# Patient Record
Sex: Female | Born: 1995 | Race: White | Hispanic: No | Marital: Single | Smoking: Never smoker
Health system: Southern US, Community
[De-identification: ages and names within clinical notes are randomized; demographics above are authoritative.]

## PROBLEM LIST (undated history)

## (undated) HISTORY — PX: WISDOM TOOTH EXTRACTION: SHX21

---
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
## 2015-04-28 IMAGING — CR DG KNEE COMPLETE 4+V*R*
4 series · 4 of 4 positions shown · non-contrast
Comparison: None.

CLINICAL DATA: Pain post trauma

EXAM:
RIGHT KNEE - COMPLETE 4+ VIEW

[t knee ap right]
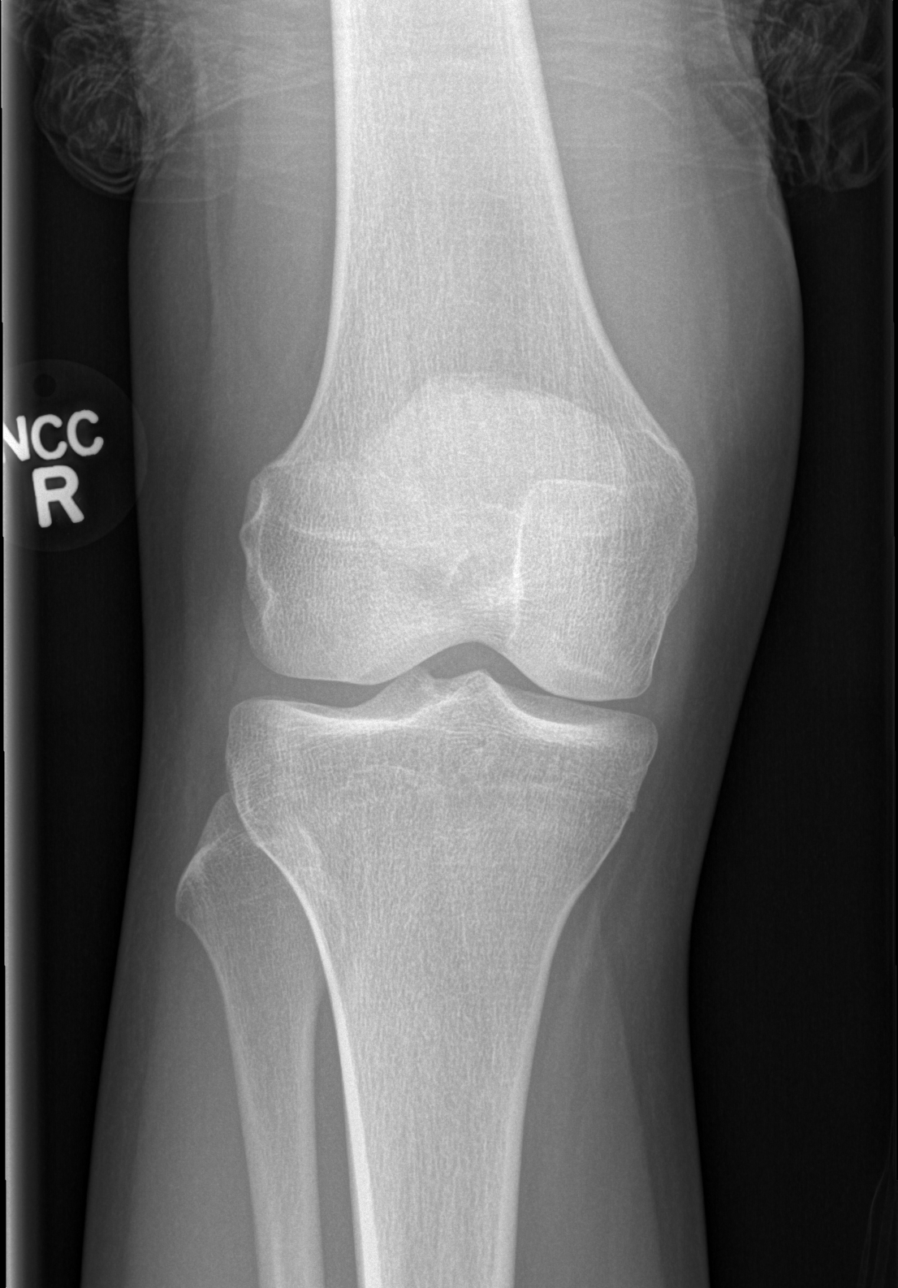

[t knee obl right (1 of 2)]
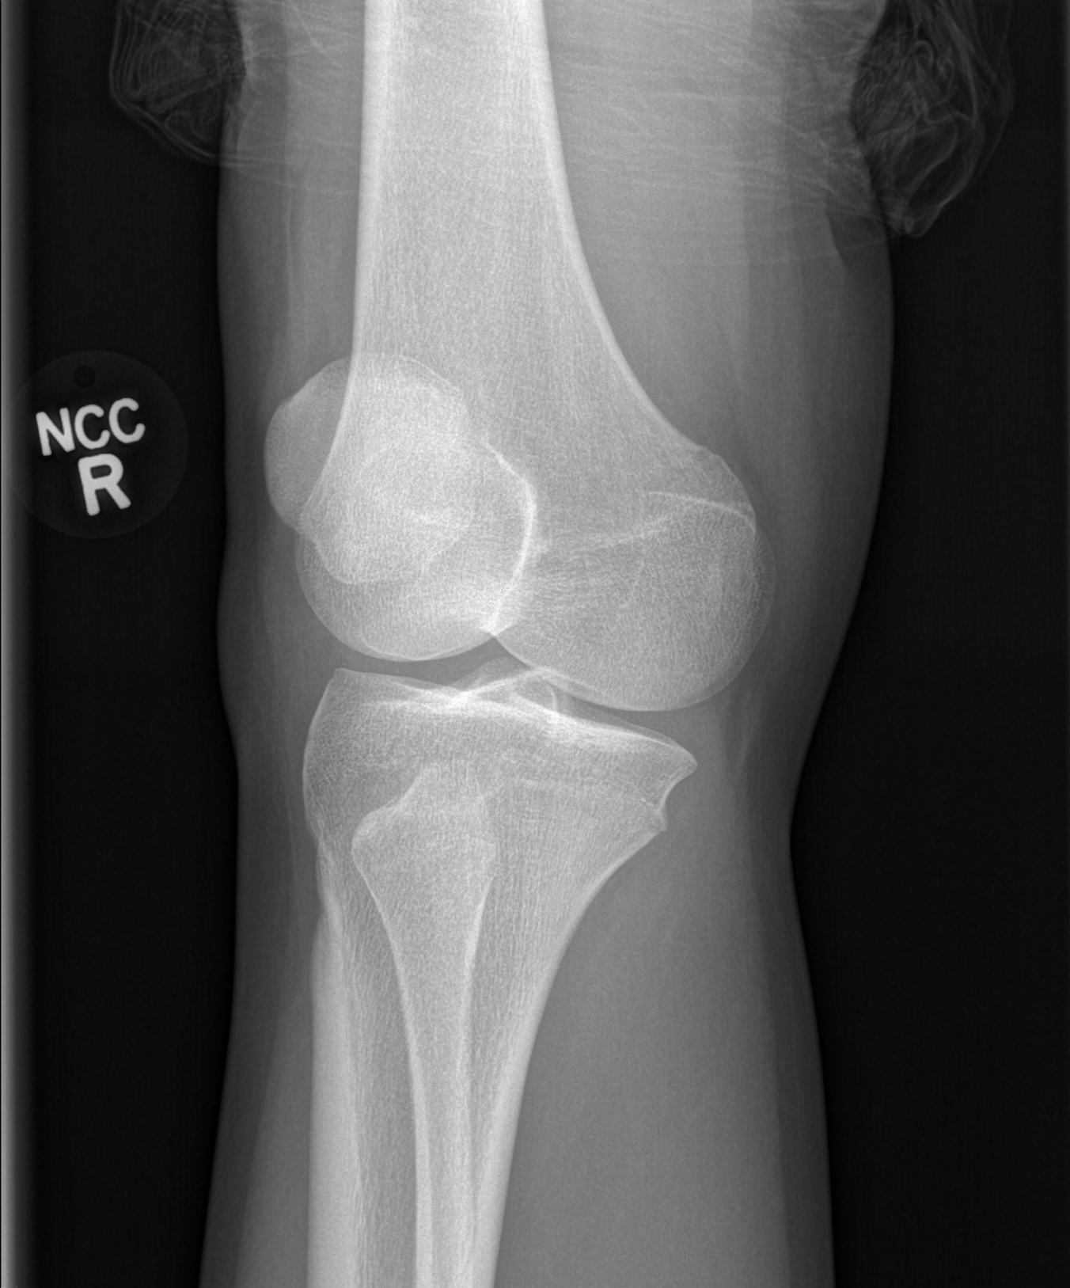

[t knee obl right (2 of 2)]
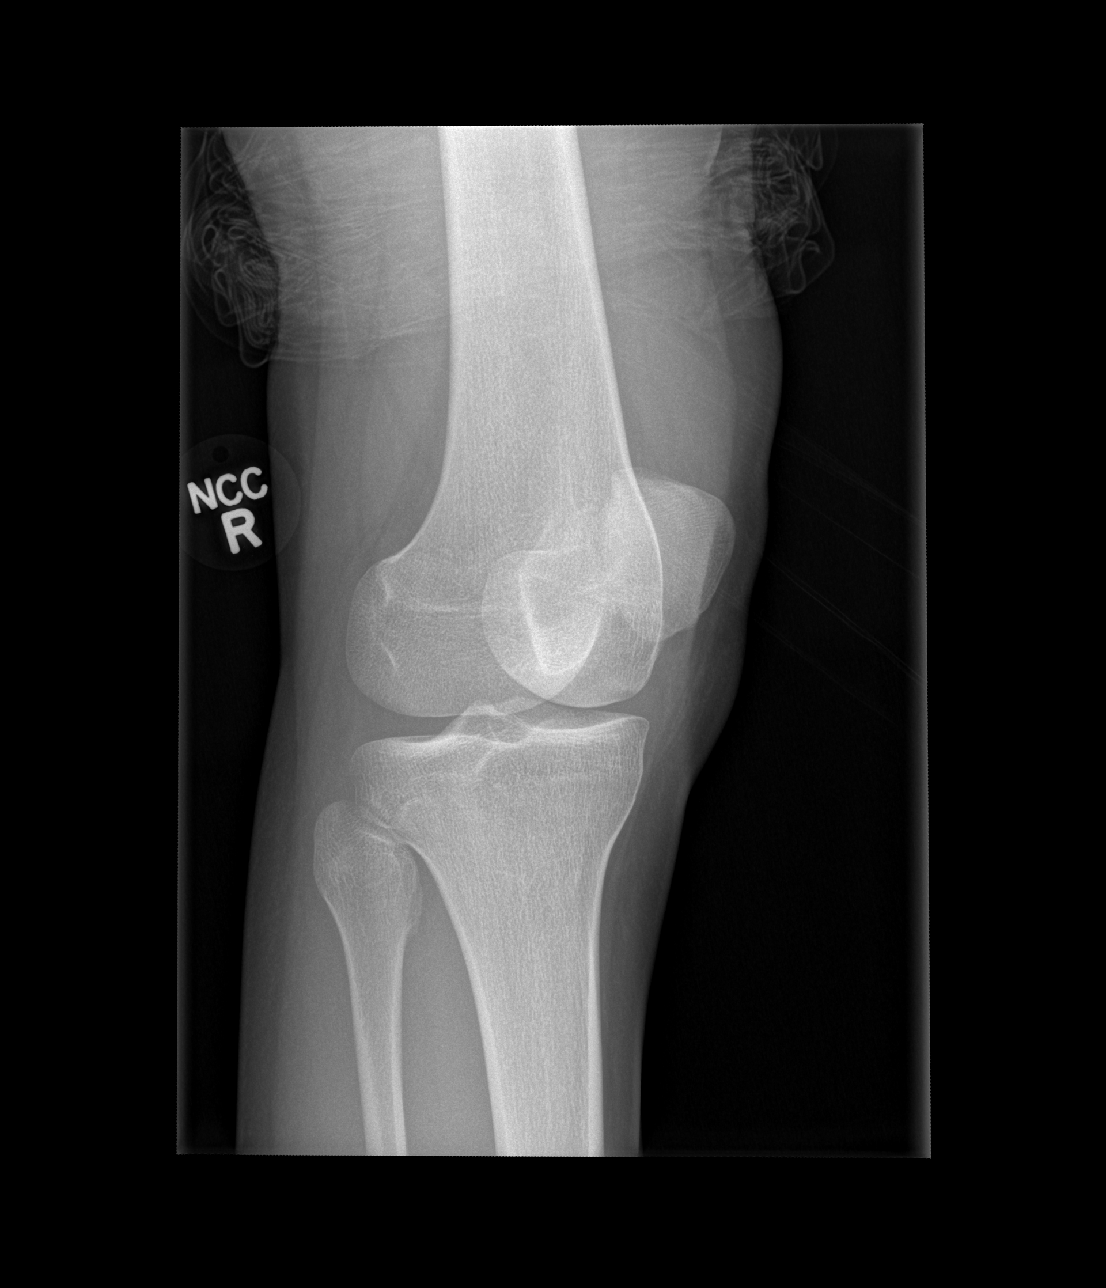

[t knee lat right]
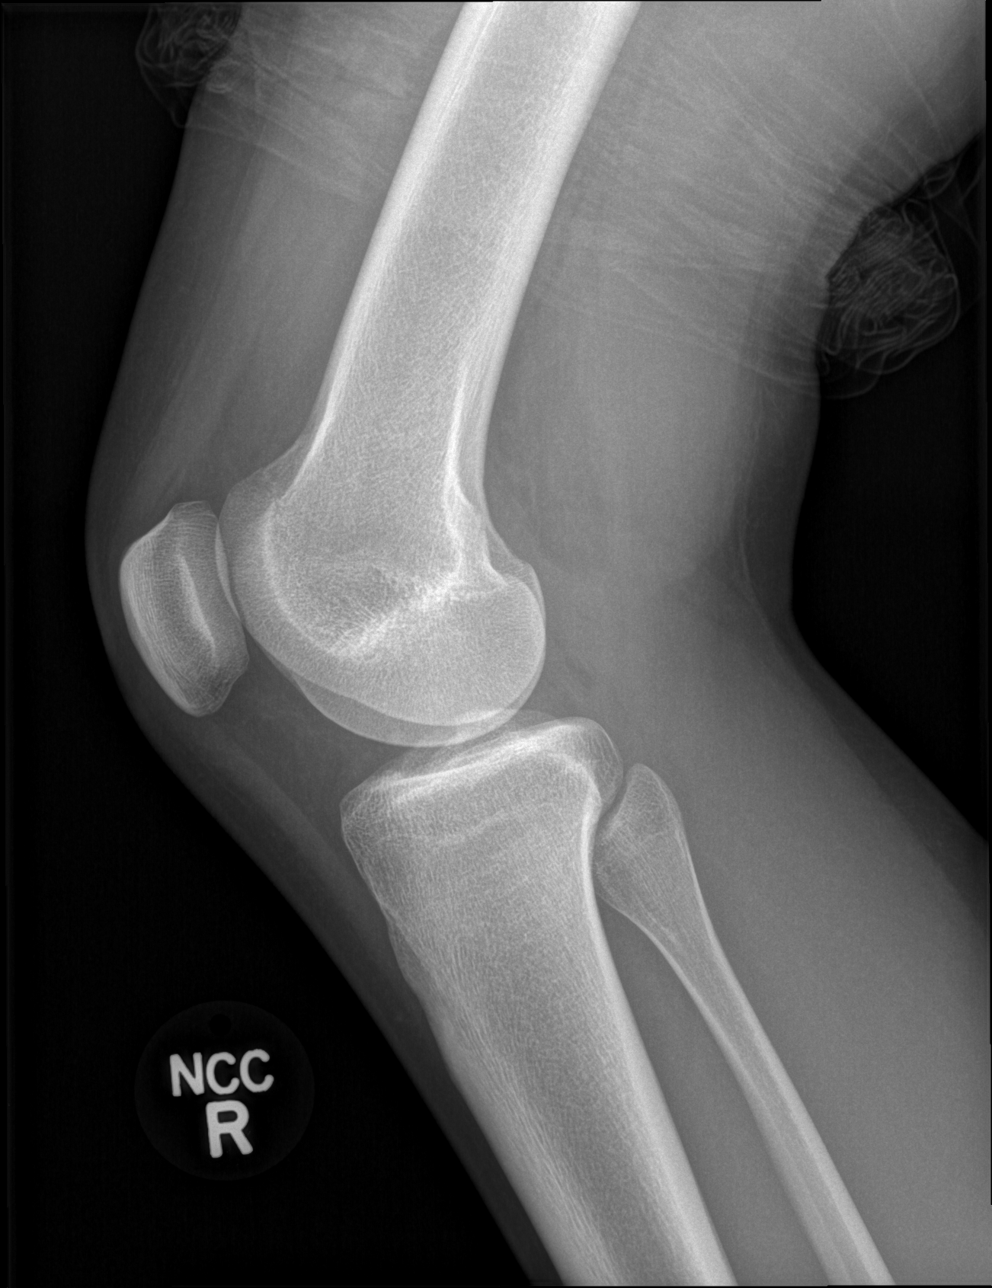

[4 of 4 positions shown; findings below may reference images not displayed]

FINDINGS: Frontal, lateral, and bilateral oblique views were obtained. There
is no fracture, dislocation, or effusion. Joint spaces appear
intact. No erosive change. No bony destruction. There is no apparent
soft tissue abscess.
IMPRESSION: No abnormality noted radiographically.

## 2015-05-16 ENCOUNTER — Ambulatory Visit (INDEPENDENT_AMBULATORY_CARE_PROVIDER_SITE_OTHER): Payer: BC Managed Care – PPO | Admitting: Emergency Medicine

## 2015-05-16 VITALS — BP 118/76 | HR 97 | Temp 99.2°F | Resp 16 | Ht 65.0 in | Wt 114.0 lb

## 2015-05-16 DIAGNOSIS — J0140 Acute pansinusitis, unspecified: Secondary | ICD-10-CM

## 2015-05-16 DIAGNOSIS — J209 Acute bronchitis, unspecified: Secondary | ICD-10-CM

## 2015-05-16 DIAGNOSIS — J0300 Acute streptococcal tonsillitis, unspecified: Secondary | ICD-10-CM | POA: Diagnosis not present

## 2015-05-16 MED ORDER — AMOXICILLIN-POT CLAVULANATE 875-125 MG PO TABS: 1.0000 | ORAL_TABLET | Freq: Two times a day (BID) | ORAL | Status: DC

## 2015-05-16 MED ORDER — PSEUDOEPHEDRINE-GUAIFENESIN ER 60-600 MG PO TB12: 1.0000 | ORAL_TABLET | Freq: Two times a day (BID) | ORAL | Status: AC

## 2015-05-16 MED ORDER — HYDROCOD POLST-CPM POLST ER 10-8 MG/5ML PO SUER: 5.0000 mL | Freq: Two times a day (BID) | ORAL | Status: DC

## 2015-05-16 NOTE — Progress Notes (Signed)
Subjective:  Patient ID: Morgan Mercer, female    DOB: [birth year]  Age: 19 y.o. MRN: [id number]  CC: Sore Throat; Sinus Problem; Generalized Body Aches; and Headache   HPI Morgan Mercer presents  patient was seen [facility] [place] told she did not have strep throat she was discharged and now has come into the office with marked sore throat she has green purulent nasal drainage and postnasal drip. She has no nausea vomiting or stool change she has a nonproductive cough she has marked myalgias arthralgias or fever. She has no chills nausea vomiting stool change or rash. She's been unable to control her symptoms with over-the-counter medication  History Morgan Mercer has no past medical history on file.   She has past surgical history that includes Wisdom tooth extraction.   Her  family history includes Asthma in her sister; Cancer in her paternal uncle; Diabetes in her paternal uncle; Hypertension in her maternal grandmother; Hypothyroidism in her maternal aunt, maternal grandfather, and paternal grandmother.  She   reports that she has never smoked. She has never used smokeless tobacco. She reports that she does not drink alcohol or use illicit drugs.  Outpatient Prescriptions Prior to Visit  Medication Sig Dispense Refill  . lisdexamfetamine (VYVANSE) 20 MG capsule Take 20 mg by mouth daily.    . AMOXICILLIN PO Take by mouth 3 (three) times daily.    Marland Kitchen HYDROcodone-acetaminophen (NORCO/VICODIN) 5-325 MG per tablet Take 1 tablet by mouth every 4 (four) hours as needed. (Patient not taking: Reported on 05/16/2015) 15 tablet 0   No facility-administered medications prior to visit.    Social History   Social History  . Marital Status: Single    Spouse Name: N/A  . Number of Children: N/A  . Years of Education: N/A   Social History Main Topics  . Smoking status: Never Smoker   . Smokeless tobacco: Never Used  . Alcohol Use: No  . Drug Use: No  . Sexual Activity: Not  Asked   Other Topics Concern  . None   Social History Narrative     Review of Systems  Constitutional: Positive for fever, chills and fatigue. Negative for appetite change.  HENT: Positive for congestion, postnasal drip, rhinorrhea, sinus pressure and sore throat. Negative for ear pain.   Eyes: Negative for pain and redness.  Respiratory: Positive for cough. Negative for shortness of breath and wheezing.   Cardiovascular: Negative for leg swelling.  Gastrointestinal: Negative for nausea, vomiting, abdominal pain, diarrhea, constipation and blood in stool.  Endocrine: Negative for polyuria.  Genitourinary: Negative for dysuria, urgency, frequency and flank pain.  Musculoskeletal: Negative for gait problem.  Skin: Negative for rash.  Neurological: Negative for weakness and headaches.  Psychiatric/Behavioral: Negative for confusion and decreased concentration. The patient is not nervous/anxious.     Objective:  BP 118/76 mmHg  Pulse 97  Temp(Src) 99.2 F (37.3 C) (Oral)  Resp 16  Ht  (1.651 m)  Wt 114 lb (51.71 kg)  BMI 18.97 kg/m2  SpO2 97%  Physical Exam  Constitutional: She is oriented to person, place, and time. She appears well-developed and well-nourished. No distress.  HENT:  Head: Normocephalic and atraumatic.  Right Ear: External ear normal.  Left Ear: External ear normal.  Nose: Nose normal.  Mouth/Throat: Oropharyngeal exudate and posterior oropharyngeal erythema present.  Eyes: Conjunctivae and EOM are normal. Pupils are equal, round, and reactive to light. No scleral icterus.  Neck: Normal range of motion. Neck supple. No  tracheal deviation present.  Cardiovascular: Normal rate, regular rhythm and normal heart sounds.   Pulmonary/Chest: Effort normal. No respiratory distress. She has no wheezes. She has no rales.  Abdominal: She exhibits no mass. There is no tenderness. There is no rebound and no guarding.  Musculoskeletal: She exhibits no edema.    Lymphadenopathy:    She has no cervical adenopathy.  Neurological: She is alert and oriented to person, place, and time. Coordination normal.  Skin: Skin is warm and dry. No rash noted.  Psychiatric: She has a normal mood and affect. Her behavior is normal.   she has moderate purulent nasal drainage on examination  Assessment & Plan:   Morgan Mercer was seen today for sore throat, sinus problem, generalized body aches and headache.  Diagnoses and all orders for this visit:  Acute pansinusitis, recurrence not specified  Acute bronchitis, unspecified organism  Other orders -     amoxicillin-clavulanate (AUGMENTIN) 875-125 MG tablet; Take 1 tablet by mouth 2 (two) times daily. -     pseudoephedrine-guaifenesin (MUCINEX D) 60-600 MG 12 hr tablet; Take 1 tablet by mouth every 12 (twelve) hours. -     chlorpheniramine-HYDROcodone (TUSSIONEX PENNKINETIC ER) 10-8 MG/5ML SUER; Take 5 mLs by mouth 2 (two) times daily.   I have discontinued Ms. Dionisio Davidricksen's AMOXICILLIN PO and HYDROcodone-acetaminophen. I am also having her start on amoxicillin-clavulanate, pseudoephedrine-guaifenesin, and chlorpheniramine-HYDROcodone. Additionally, I am having her maintain her lisdexamfetamine.  Meds ordered this encounter  Medications  . amoxicillin-clavulanate (AUGMENTIN) 875-125 MG tablet    Sig: Take 1 tablet by mouth 2 (two) times daily.    Dispense:  20 tablet    Refill:  0  . pseudoephedrine-guaifenesin (MUCINEX D) 60-600 MG 12 hr tablet    Sig: Take 1 tablet by mouth every 12 (twelve) hours.    Dispense:  18 tablet    Refill:  0  . chlorpheniramine-HYDROcodone (TUSSIONEX PENNKINETIC ER) 10-8 MG/5ML SUER    Sig: Take 5 mLs by mouth 2 (two) times daily.    Dispense:  60 mL    Refill:  0    Appropriate red flag conditions were discussed with the patient as well as actions that should be taken.  Patient expressed his understanding.  Follow-up: Return if symptoms worsen or fail to improve.  Carmelina DaneAnderson,  Keenon Leitzel S, MD

## 2015-05-16 NOTE — Patient Instructions (Signed)

## 2015-06-14 ENCOUNTER — Ambulatory Visit (INDEPENDENT_AMBULATORY_CARE_PROVIDER_SITE_OTHER): Payer: BC Managed Care – PPO | Admitting: Family Medicine

## 2015-06-14 VITALS — BP 114/66 | HR 92 | Temp 98.3°F | Resp 17 | Ht 65.0 in | Wt 111.0 lb

## 2015-06-14 DIAGNOSIS — R0982 Postnasal drip: Secondary | ICD-10-CM | POA: Diagnosis not present

## 2015-06-14 DIAGNOSIS — J029 Acute pharyngitis, unspecified: Secondary | ICD-10-CM | POA: Diagnosis not present

## 2015-06-14 LAB — COMPREHENSIVE METABOLIC PANEL
ALBUMIN: 4.6 g/dL (ref 3.6–5.1)
ALK PHOS: 68 U/L (ref 47–176)
ALT: 8 U/L (ref 5–32)
AST: 16 U/L (ref 12–32)
BILIRUBIN TOTAL: 0.5 mg/dL (ref 0.2–1.1)
BUN: 14 mg/dL (ref 7–20)
CALCIUM: 10.2 mg/dL (ref 8.9–10.4)
CO2: 27 mmol/L (ref 20–31)
Chloride: 106 mmol/L (ref 98–110)
Creat: 0.72 mg/dL (ref 0.50–1.00)
GLUCOSE: 103 mg/dL — AB (ref 65–99)
POTASSIUM: 4.9 mmol/L (ref 3.8–5.1)
Sodium: 141 mmol/L (ref 135–146)
Total Protein: 7.4 g/dL (ref 6.3–8.2)

## 2015-06-14 LAB — POCT CBC
Granulocyte percent: 79.8 %G (ref 37–80)
HEMATOCRIT: 42.2 % (ref 37.7–47.9)
HEMOGLOBIN: 14.4 g/dL (ref 12.2–16.2)
Lymph, poc: 1.3 (ref 0.6–3.4)
MCH: 30.1 pg (ref 27–31.2)
MCHC: 34.2 g/dL (ref 31.8–35.4)
MCV: 88 fL (ref 80–97)
MID (CBC): 0.5 (ref 0–0.9)
MPV: 6.5 fL (ref 0–99.8)
POC GRANULOCYTE: 7.3 — AB (ref 2–6.9)
POC LYMPH PERCENT: 14.3 %L (ref 10–50)
POC MID %: 5.9 % (ref 0–12)
Platelet Count, POC: 272 10*3/uL (ref 142–424)
RBC: 4.79 M/uL (ref 4.04–5.48)
RDW, POC: 13.6 %
WBC: 9.2 10*3/uL (ref 4.6–10.2)

## 2015-06-14 LAB — POCT RAPID STREP A (OFFICE): Rapid Strep A Screen: NEGATIVE

## 2015-06-14 MED ORDER — IPRATROPIUM BROMIDE 0.03 % NA SOLN: 2.0000 | Freq: Four times a day (QID) | NASAL | Status: AC

## 2015-06-14 NOTE — Progress Notes (Addendum)
[facility] [address], [place] [id number] [phone]  Date:  06/14/2015   Name:  Morgan Mercer   DOB:  May 05, 1996   MRN:  [id number]  PCP:  Allison QuarryWISELTON,LOUISE A, MD    Chief Complaint: Sore Throat and Sinusitis   History of Present Illness:  Morgan Mercer is a 19 y.o. very pleasant female patient who presents with the following:  She was here just a month ago with a sinus infection- treated with augmentin for 10 days.  She has not felt that she got 100% well between then and now.   She notes that "my throat hurts all the time.'  She has had strep twice this fall and a couple of sinus infections as well.  In the past she has not tended to be sick a lot    Right now she notes nasal congestion, a ST, and PND.  When hse wakes up in the am will feel like there is a lot of mucus in her throat.   She did not have a lot of strep infections as a younger child.   No cough, no fever. 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 Gastroenteritis, acute 10/03/2012  . Dehydration 10/03/2012  . Abdominal pain 10/03/2012    No past medical history on file.  Past Surgical History  Procedure Laterality Date  . Wisdom tooth extraction      x4; 07/19/14    Social History  Substance Use Topics  . Smoking status: Never Smoker   . Smokeless tobacco: Never Used  . Alcohol Use: No    Family History  Problem Relation Age of Onset  . Asthma Sister   . Hypertension Maternal Grandmother   . Diabetes Paternal Uncle   . Cancer Paternal Uncle   . Hypothyroidism Maternal Grandfather   . Hypothyroidism Paternal Grandmother   . Hypothyroidism Maternal Aunt     No Known Allergies  Medication list has been reviewed and updated.  Current Outpatient Prescriptions on File Prior to Visit  Medication Sig Dispense Refill  . lisdexamfetamine  (VYVANSE) 20 MG capsule Take 20 mg by mouth daily.    . pseudoephedrine-guaifenesin (MUCINEX D) 60-600 MG 12 hr tablet Take 1 tablet by mouth every 12 (twelve) hours. (Patient not taking: Reported on 06/14/2015) 18 tablet 0   No current facility-administered medications on file prior to visit.    Review of Systems:  As per HPI- otherwise negative.   Physical Examination: Filed Vitals:   06/14/15 1154  BP: 114/66  Pulse: 92  Temp: 98.3 F (36.8 C)  Resp: 17   Filed Vitals:   06/14/15 1154  Height: 5\' 5"  (1.651 m)  Weight: 111 lb (50.349 kg)   Body mass index is 18.47 kg/(m^2). Ideal Body Weight: Weight in (lb) to have BMI = 25: 149.9  GEN: WDWN, NAD, Non-toxic, A & O x 3, looks well, slim build HEENT: Atraumatic, Normocephalic. Neck supple. No masses, No LAD.  Bilateral TM wnl, oropharynx displays generous but not enlarged tonsils, erythematous but no exudate.  PEERL,EOMI.   Ears and Nose: No external deformity. CV: RRR, No M/G/R. No JVD. No thrill. No extra heart sounds. PULM: CTA B, no wheezes, crackles, rhonchi. No retractions. No resp. distress. No accessory muscle use. ABD: S, NT, ND. No rebound. No HSM. EXTR: No  c/c/e NEURO Normal gait.  PSYCH: Normally interactive. Conversant. Not depressed or anxious appearing.  Calm demeanor.   Assessment and Plan: Sore throat - Plan: POCT CBC, POCT rapid strep A, Comprehensive metabolic panel, Epstein-Barr virus VCA antibody panel, Culture, Group A Strep  PND (post-nasal drip) - Plan: ipratropium (ATROVENT) 0.03 % nasal spray  Here today with persistent nasal and throat sx for the last couple of months. At this time do not see any indication for an abx. Will try atrovent nasal and OTC medications as needed They may consider having her see ENT for an opinion and will keep me posted if they need anything .Will plan further follow- up pending labs.    Signed Abbe Amsterdam, MD  Received the rest of her labs--called and LMOM.    No acute mono or strep, other labs are also normal.  If she needs anything else or if not feeling better please let me know Results for orders placed or performed in visit on 06/14/15  Culture, Group A Strep  Result Value Ref Range   Organism ID, Bacteria Normal Upper Respiratory Flora    Organism ID, Bacteria No Beta Hemolytic Streptococci Isolated   Comprehensive metabolic panel  Result Value Ref Range   Sodium 141 135 - 146 mmol/L   Potassium 4.9 3.8 - 5.1 mmol/L   Chloride 106 98 - 110 mmol/L   CO2 27 20 - 31 mmol/L   Glucose, Bld 103 (H) 65 - 99 mg/dL   BUN 14 7 - 20 mg/dL   Creat 8.29 5.62 - 1.30 mg/dL   Total Bilirubin 0.5 0.2 - 1.1 mg/dL   Alkaline Phosphatase 68 47 - 176 U/L   AST 16 12 - 32 U/L   ALT 8 5 - 32 U/L   Total Protein 7.4 6.3 - 8.2 g/dL   Albumin 4.6 3.6 - 5.1 g/dL   Calcium 86.5 8.9 - 78.4 mg/dL  Epstein-Barr virus VCA antibody panel  Result Value Ref Range   EBV VCA IgG 587.0 (H) <18.0 U/mL   EBV VCA IgM <10.0 <36.0 U/mL   EBV EA IgG <5.0 <9.0 U/mL   EBV NA IgG 59.5 (H) <18.0 U/mL  POCT CBC  Result Value Ref Range   WBC 9.2 4.6 - 10.2 K/uL   Lymph, poc 1.3 0.6 - 3.4   POC LYMPH PERCENT 14.3 10 - 50 %L   MID (cbc) 0.5 0 - 0.9   POC MID % 5.9 0 - 12 %M   POC Granulocyte 7.3 (A) 2 - 6.9   Granulocyte percent 79.8 37 - 80 %G   RBC 4.79 4.04 - 5.48 M/uL   Hemoglobin 14.4 12.2 - 16.2 g/dL   HCT, POC 69.6 29.5 - 47.9 %   MCV 88.0 80 - 97 fL   MCH, POC 30.1 27 - 31.2 pg   MCHC 34.2 31.8 - 35.4 g/dL   RDW, POC 28.4 %   Platelet Count, POC 272 142 - 424 K/uL   MPV 6.5 0 - 99.8 fL  POCT rapid strep A  Result Value Ref Range   Rapid Strep A Screen Negative Negative

## 2015-06-14 NOTE — Patient Instructions (Signed)
We are going to try atrovent nasal spray for your nasal symptoms and post- nasal drainage leading to sore throat Also use some claritin or zyrtec OTC once a day for allergy symptoms Some OTC afrin nasal spray for a few days will help with nasal congestion I will be in touch with the rest of your labs  Please let me know if you symptoms are getting worse or changing in any way  If you would like to see ENT [facility], but you could certainly also be [place] in [place] if you like

## 2015-06-15 LAB — EPSTEIN-BARR VIRUS VCA ANTIBODY PANEL
EBV NA IGG: 59.5 U/mL — AB (ref <18.0)
EBV VCA IgG: 587 U/mL — ABNORMAL HIGH (ref <18.0)

## 2015-06-16 ENCOUNTER — Encounter: Payer: Self-pay | Admitting: Family Medicine

## 2015-06-16 LAB — CULTURE, GROUP A STREP: Organism ID, Bacteria: NORMAL

## 2015-12-17 ENCOUNTER — Ambulatory Visit (INDEPENDENT_AMBULATORY_CARE_PROVIDER_SITE_OTHER): Payer: BC Managed Care – PPO | Admitting: Family Medicine

## 2015-12-17 VITALS — BP 112/76 | HR 104 | Temp 98.7°F | Resp 18 | Ht 65.5 in | Wt 104.0 lb

## 2015-12-17 DIAGNOSIS — R509 Fever, unspecified: Secondary | ICD-10-CM

## 2015-12-17 DIAGNOSIS — H9203 Otalgia, bilateral: Secondary | ICD-10-CM

## 2015-12-17 DIAGNOSIS — J029 Acute pharyngitis, unspecified: Secondary | ICD-10-CM | POA: Diagnosis not present

## 2015-12-17 LAB — POCT RAPID STREP A (OFFICE): Rapid Strep A Screen: NEGATIVE

## 2015-12-17 MED ORDER — IBUPROFEN 200 MG PO TABS
600.0000 mg | ORAL_TABLET | Freq: Once | ORAL | Status: AC
  Administered 2015-12-17: 600 mg via ORAL

## 2015-12-17 MED ORDER — AMOXICILLIN 875 MG PO TABS: 875.0000 mg | ORAL_TABLET | Freq: Two times a day (BID) | ORAL | Status: DC

## 2015-12-17 NOTE — Progress Notes (Signed)
Patient ID: Morgan Mercer, female    DOB: [birth year]  Age: 20 y.o. MRN: [id number]  Chief Complaint  Patient presents with  . Sore Throat    started yesterday around 6pm  . 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 She was got her tonsils taken out in May but decided not to. She is now having a bad sore throat again since yesterday. She had a fever up to 103 last night. Her skin aches, her body aches, her headaches. Her throat is sore and has pain in both ears.  Current allergies, medications, problem list, past/family and social histories reviewed.  Objective:  BP 112/76 mmHg  Pulse 104  Temp(Src) 98.7 F (37.1 C) (Oral)  Resp 18  Ht 5' 5.5" (1.664 m)  Wt 104 lb (47.174 kg)  BMI 17.04 kg/m2  SpO2 98%  Looks like she feels bad. Holding a blanket around her. Her TMs are normal. Throat erythematous with some exudate on the right especially. Supple with large nodes. Chest clear to auscultation. Heart regular without murmurs. No axillary or inguinal nodes.  Assessment & Plan:   Assessment: 1. Acute pharyngitis, unspecified etiology   2. Otalgia of both ears   3. Fever, unspecified fever cause       Plan: Rapid strep test and throat culture if needed. With her history and symptoms plan to treat her pending culture if needed.  Orders Placed This Encounter  Procedures  . Culture, Group A Strep    Order Specific Question:  Source    Answer:  throat  . POCT rapid strep A    Meds ordered this encounter  Medications  . amoxicillin (AMOXIL) 875 MG tablet    Sig: Take 1 tablet (875 mg total) by mouth 2 (two) times daily.    Dispense:  20 tablet    Refill:  0  . ibuprofen (ADVIL,MOTRIN) tablet 600 mg    Sig:     Results for orders placed or performed in visit on 12/17/15  POCT rapid strep A  Result Value Ref Range   Rapid Strep A Screen Negative Negative        Patient  Instructions    Amoxicillin 875 mg one twice daily. If the strep culture is negative you can discontinue the antibiotic.  Drink plenty of fluids and get enough rest  Tylenol 1000 mg (5002) 3 times daily or ibuprofen 600-800 milligrams 3 times daily as needed for fever. You can alternate the 2 if needed.  Return as necessary  Pharyngitis Pharyngitis is redness, pain, and swelling (inflammation) of your pharynx.  CAUSES  Pharyngitis is usually caused by infection. Most of the time, these infections are from viruses (viral) and are part of a cold. However, sometimes pharyngitis is caused by bacteria (bacterial). Pharyngitis can also be caused by allergies. Viral pharyngitis may be spread from person to person by coughing, sneezing, and personal items or utensils (cups, forks, spoons, toothbrushes). Bacterial pharyngitis may be spread from person to person by more intimate contact, such as kissing.  SIGNS AND SYMPTOMS  Symptoms of pharyngitis include:   Sore throat.   Tiredness (fatigue).   Low-grade fever.   Headache.  Joint pain and muscle aches.  Skin rashes.  Swollen lymph nodes.  Plaque-like film on throat or tonsils (often seen with bacterial pharyngitis). DIAGNOSIS  Your health care provider will ask you questions about your illness  and your symptoms. Your medical history, along with a physical exam, is often all that is needed to diagnose pharyngitis. Sometimes, a rapid strep test is done. Other lab tests may also be done, depending on the suspected cause.  TREATMENT  Viral pharyngitis will usually get better in 3-4 days without the use of medicine. Bacterial pharyngitis is treated with medicines that kill germs (antibiotics).  HOME CARE INSTRUCTIONS   Drink enough water and fluids to keep your urine clear or pale yellow.   Only take over-the-counter or prescription medicines as directed by your health care provider:   If you are prescribed antibiotics, make sure  you finish them even if you start to feel better.   Do not take aspirin.   Get lots of rest.   Gargle with 8 oz of salt water ( tsp of salt per 1 qt of water) as often as every 1-2 hours to soothe your throat.   Throat lozenges (if you are not at risk for choking) or sprays may be used to soothe your throat. SEEK MEDICAL CARE IF:   You have large, tender lumps in your neck.  You have a rash.  You cough up green, yellow-brown, or bloody spit. SEEK IMMEDIATE MEDICAL CARE IF:   Your neck becomes stiff.  You drool or are unable to swallow liquids.  You vomit or are unable to keep medicines or liquids down.  You have severe pain that does not go away with the use of recommended medicines.  You have trouble breathing (not caused by a stuffy nose). MAKE SURE YOU:   Understand these instructions.  Will watch your condition.  Will get help right away if you are not doing well or get worse.   This information is not intended to replace advice given to you by your health care provider. Make sure you discuss any questions you have with your health care provider.   Document Released: 06/09/2005 Document Revised: 03/30/2013 Document Reviewed: 02/14/2013 Elsevier Interactive Patient Education 2016 [employer]   IF you received an x-ray today, you will receive an invoice from [facility]. Please contact [facility] at [phone] with questions or concerns regarding your invoice.   IF you received labwork today, you will receive an invoice from [facility]. Please contact [facility] at [phone] with questions or concerns regarding your invoice.   Our billing staff will not be able to assist you with questions regarding bills from these companies.  You will be contacted with the lab results as soon as they are available. The fastest way to get your results is to activate your My Chart account. Instructions are located on the last  page of this paperwork. If you have not heard from us regarding the results in 2 weeks, please contact this office.          Return if symptoms worsen or fail to improve.   Janathan Bribiesca, MD 12/17/2015

## 2015-12-17 NOTE — Progress Notes (Signed)
Patient ID: Morgan Mercer, female    DOB: 17-Jan-1996  Age: 20 y.o. MRN: [id number]  Chief Complaint  Patient presents with  . Sore Throat    started yesterday around 6pm  . Fever    started yesterday around 6pm     Subjective:    Morgan Mercer has a history of a lot of sore throats. She has been sick since yesterday with temperature up to 103. Current allergies, medications, problem list, past/family and social histories reviewed.  Objective:  BP 112/76 mmHg  Pulse 104  Temp(Src) 98.7 F (37.1 C) (Oral)  Resp 18  Ht 5' 5.5" (1.664 m)  Wt 104 lb (47.174 kg)  BMI 17.04 kg/m2  SpO2 98%  Duplicate  Assessment & Plan:   Assessment: No diagnosis found.    Plan: Duplicate, see other note  No orders of the defined types were placed in this encounter.    No orders of the defined types were placed in this encounter.         Patient Instructions       IF you received an x-ray today, you will receive an invoice from [facility]. Please contact [facility] at [phone] with questions or concerns regarding your invoice.   IF you received labwork today, you will receive an invoice from [facility]. Please contact [facility] at [phone] with questions or concerns regarding your invoice.   Our billing staff will not be able to assist you with questions regarding bills from these companies.  You will be contacted with the lab results as soon as they are available. The fastest way to get your results is to activate your My Chart account. Instructions are located on the last page of this paperwork. If you have not heard from us regarding the results in 2 weeks, please contact this office.         No Follow-up on file.   HOPPER,DAVID, MD 12/17/2015

## 2015-12-17 NOTE — Patient Instructions (Addendum)
Amoxicillin 875 mg one twice daily. If the strep culture is negative you can discontinue the antibiotic.  Drink plenty of fluids and get enough rest  Tylenol 1000 mg (5002) 3 times daily or ibuprofen 600-800 milligrams 3 times daily as needed for fever. You can alternate the 2 if needed.  Return as necessary  Pharyngitis Pharyngitis is redness, pain, and swelling (inflammation) of your pharynx.  CAUSES  Pharyngitis is usually caused by infection. Most of the time, these infections are from viruses (viral) and are part of a cold. However, sometimes pharyngitis is caused by bacteria (bacterial). Pharyngitis can also be caused by allergies. Viral pharyngitis may be spread from person to person by coughing, sneezing, and personal items or utensils (cups, forks, spoons, toothbrushes). Bacterial pharyngitis may be spread from person to person by more intimate contact, such as kissing.  SIGNS AND SYMPTOMS  Symptoms of pharyngitis include:   Sore throat.   Tiredness (fatigue).   Low-grade fever.   Headache.  Joint pain and muscle aches.  Skin rashes.  Swollen lymph nodes.  Plaque-like film on throat or tonsils (often seen with bacterial pharyngitis). DIAGNOSIS  Your health care provider will ask you questions about your illness and your symptoms. Your medical history, along with a physical exam, is often all that is needed to diagnose pharyngitis. Sometimes, a rapid strep test is done. Other lab tests may also be done, depending on the suspected cause.  TREATMENT  Viral pharyngitis will usually get better in 3-4 days without the use of medicine. Bacterial pharyngitis is treated with medicines that kill germs (antibiotics).  HOME CARE INSTRUCTIONS   Drink enough water and fluids to keep your urine clear or pale yellow.   Only take over-the-counter or prescription medicines as directed by your health care provider:   If you are prescribed antibiotics, make sure you finish them  even if you start to feel better.   Do not take aspirin.   Get lots of rest.   Gargle with 8 oz of salt water ( tsp of salt per 1 qt of water) as often as every 1-2 hours to soothe your throat.   Throat lozenges (if you are not at risk for choking) or sprays may be used to soothe your throat. SEEK MEDICAL CARE IF:   You have large, tender lumps in your neck.  You have a rash.  You cough up green, yellow-brown, or bloody spit. SEEK IMMEDIATE MEDICAL CARE IF:   Your neck becomes stiff.  You drool or are unable to swallow liquids.  You vomit or are unable to keep medicines or liquids down.  You have severe pain that does not go away with the use of recommended medicines.  You have trouble breathing (not caused by a stuffy nose). MAKE SURE YOU:   Understand these instructions.  Will watch your condition.  Will get help right away if you are not doing well or get worse.   This information is not intended to replace advice given to you by your health care provider. Make sure you discuss any questions you have with your health care provider.   Document Released: 06/09/2005 Document Revised: 03/30/2013 Document Reviewed: 02/14/2013 Elsevier Interactive Patient Education 2016 [employer]   IF you received an x-ray today, you will receive an invoice from [facility]. Please contact [facility] at [phone] with questions or concerns regarding your invoice.   IF you received labwork today, you will receive an invoice from [facility]. Please contact  [facility] at [phone] with questions or concerns regarding your invoice.   Our billing staff will not be able to assist you with questions regarding bills from these companies.  You will be contacted with the lab results as soon as they are available. The fastest way to get your results is to activate your My Chart account. Instructions are located on the last page of this  paperwork. If you have not heard from us regarding the results in 2 weeks, please contact this office.

## 2015-12-20 LAB — CULTURE, GROUP A STREP: ORGANISM ID, BACTERIA: NORMAL

## 2015-12-21 ENCOUNTER — Encounter: Payer: Self-pay | Admitting: *Deleted

## 2015-12-22 ENCOUNTER — Ambulatory Visit (INDEPENDENT_AMBULATORY_CARE_PROVIDER_SITE_OTHER): Payer: BC Managed Care – PPO | Admitting: Osteopathic Medicine

## 2015-12-22 VITALS — BP 110/70 | HR 77 | Temp 97.9°F | Resp 18 | Ht 65.5 in | Wt 116.5 lb

## 2015-12-22 DIAGNOSIS — L500 Allergic urticaria: Secondary | ICD-10-CM

## 2015-12-22 MED ORDER — METHYLPREDNISOLONE SODIUM SUCC 125 MG IJ SOLR
125.0000 mg | Freq: Once | INTRAMUSCULAR | Status: AC
  Administered 2015-12-22: 125 mg via INTRAMUSCULAR

## 2015-12-22 MED ORDER — SODIUM CHLORIDE 0.9 % IV SOLN: 80.0000 mg | Freq: Once | INTRAVENOUS | Status: DC

## 2015-12-22 MED ORDER — HYDROXYZINE HCL 50 MG PO TABS: 25.0000 mg | ORAL_TABLET | Freq: Three times a day (TID) | ORAL | Status: AC | PRN

## 2015-12-22 MED ORDER — METHYLPREDNISOLONE 4 MG PO TBPK: ORAL_TABLET | ORAL | Status: AC

## 2015-12-22 NOTE — Progress Notes (Signed)
HPI: Morgan Mercer is a 20 y.o. female who presents [facility] 12/22/2015 for chief complaint of:  Chief Complaint  Patient presents with  . Rash    on left arm, left thigh, right neck & back of left ear x 8 days. Tried topical steroid (not helping)     . Context: Thinks due to exposure to mango sap, was taking this for when rash began 8 days ago . Location: L arm, L thigh, R neck, L ear . Quality: Quite itchy . Duration: 8 days ago noticed first and most severe lesion, seems to be getting a bit better but popping up over other parts of the body . Modifying factors: topical steroid not helping  . Assoc signs/symptoms: No fever or chills, no joint pain. Patient also recently treated for pharyngitis, initially with amoxicillin however rapid strep and throat culture was negative, patient has still been taking the antibiotics   Past medical, social and family history reviewed: No past medical history on file. Past Surgical History  Procedure Laterality Date  . Wisdom tooth extraction      x4; 07/19/14   Social History  Substance Use Topics  . Smoking status: Never Smoker   . Smokeless tobacco: Never Used  . Alcohol Use: No   Family History  Problem Relation Age of Onset  . Asthma Sister   . Hypertension Maternal Grandmother   . Diabetes Paternal Uncle   . Cancer Paternal Uncle   . Hypothyroidism Maternal Grandfather   . Hypothyroidism Paternal Grandmother   . Hypothyroidism Maternal Aunt     Current Outpatient Prescriptions  Medication Sig Dispense Refill  . amoxicillin (AMOXIL) 875 MG tablet Take 1 tablet (875 mg total) by mouth 2 (two) times daily. 20 tablet 0  . etonogestrel (NEXPLANON) 68 MG IMPL implant     . lisdexamfetamine (VYVANSE) 20 MG capsule Take 20 mg by mouth daily.    Marland Kitchen. ipratropium (ATROVENT) 0.03 % nasal spray Place 2 sprays into the nose 4 (four) times daily. (Patient not taking: Reported on 12/17/2015) 30 mL 6  .  pseudoephedrine-guaifenesin (MUCINEX D) 60-600 MG 12 hr tablet Take 1 tablet by mouth every 12 (twelve) hours. (Patient not taking: Reported on 06/14/2015) 18 tablet 0   No current facility-administered medications for this visit.   No Known Allergies    Review of Systems: CONSTITUTIONAL:  No  fever, no chills, No  unintentional weight changes HEAD/EYES/EARS/NOSE/THROAT: No  headache, no vision change, no hearing change, No  sore throat, No  sinus pressure CARDIAC: No  chest pain, No  pressure, No palpitations, No  orthopnea RESPIRATORY: No  cough, No  shortness of breath/wheeze GASTROINTESTINAL: No  nausea, No  vomiting, No  abdominal pain, No  blood in stool, No  diarrhea, No  constipation  MUSCULOSKELETAL: No  myalgia/arthralgia GENITOURINARY: No  incontinence, No  abnormal genital bleeding/discharge SKIN: No  rash/wounds/concerning lesions HEM/ONC: No  easy bruising/bleeding, No  abnormal lymph node ENDOCRINE: No polyuria/polydipsia/polyphagia, No  heat/cold intolerance  NEUROLOGIC: No  weakness, No  dizziness, No  slurred speech PSYCHIATRIC: No  concerns with depression, No  concerns with anxiety, No sleep problems  Exam:  BP 110/70 mmHg  Pulse 77  Temp(Src) 97.9 F (36.6 C) (Oral)  Resp 18  Ht 5' 5.5" (1.664 m)  Wt 116 lb 8 oz (52.844 kg)  BMI 19.08 kg/m2  SpO2 98% Constitutional: VS see above. General Appearance: alert, well-developed, well-nourished, NAD Eyes: Normal lids and conjunctive, non-icteric  sclera Ears, Nose, Mouth, Throat: MMM, Normal external inspection ears/nares/mouth/lips/gums Neck: No masses, trachea midline.  Respiratory: Normal respiratory effort. Musculoskeletal: Gait normal.  Skin: Positive erythematous urticarial lesions on left wrist, right forearm, left upper thigh, mild/healing behind left ear, mild on right side of neck. Left wrist is most severe, positive edema, distinct borders. See photograph below. Skin is otherwise warm, dry, intact. No  ulceration or drainage. No concerning nevi or subq nodules on limited exam.   Psychiatric: Normal judgment/insight. Normal mood and affect. Oriented x3.   Most severe, 8 days ago - left wrist   Most recent, noted yesterday - left upper thigh      ASSESSMENT/PLAN: Steroids IM in the office today, sent home with prescription for hydroxyzine when necessary itching. Fill steroid Dosepak oral taper if no better in the next few days. Most likely allergic urticaria, possible due to exposure to mango, however still having some hives lesions pop up, keep a close eye on any changes to exposure, particularly soaps or detergents, exposure to pets or insects. Consider biopsy if no improvement. Can go ahead and stop the amoxicillin since strep culture was negative. Consider biopsy/dermatology referral if no improvement.  Allergic urticaria - Plan: methylPREDNISolone sodium succinate (SOLU-MEDROL) 80 mg in sodium chloride 0.9 % 50 mL IVPB, methylPREDNISolone (MEDROL DOSEPAK) 4 MG TBPK tablet, hydrOXYzine (ATARAX/VISTARIL) 50 MG tablet - Solu-Medrol 125 mg was given, unable to cancel/change order for 80 mg    Visit summary printed and instructions reviewed with the patient. ER/RTC precautions were reviewed. All questions answered. Return if symptoms worsen or fail to improve.

## 2015-12-22 NOTE — Patient Instructions (Addendum)
     IF you received an x-ray today, you will receive an invoice from [facility]. Please contact [facility] at [phone] with questions or concerns regarding your invoice.   IF you received labwork today, you will receive an invoice from [facility]. Please contact [facility] at [phone] with questions or concerns regarding your invoice.   Our billing staff will not be able to assist you with questions regarding bills from these companies.  You will be contacted with the lab results as soon as they are available. The fastest way to get your results is to activate your My Chart account. Instructions are located on the last page of this paperwork. If you have not heard from us regarding the results in 2 weeks, please contact this office.     Hives / Urticaria Hives are itchy, red, swollen areas of the skin. They can vary in size and location on your body. Hives can come and go for hours or several days (acute hives) or for several weeks (chronic hives). Hives do not spread from person to person (noncontagious). They may get worse with scratching, exercise, and emotional stress. CAUSES   Allergic reaction to food, additives, or drugs.  Infections, including the common cold.  Illness, such as vasculitis, lupus, or thyroid disease.  Exposure to sunlight, heat, or cold.  Exercise.  Stress.  Contact with chemicals. SYMPTOMS   Red or white swollen patches on the skin. The patches may change size, shape, and location quickly and repeatedly.  Itching.  Swelling of the hands, feet, and face. This may occur if hives develop deeper in the skin. DIAGNOSIS  Your caregiver can usually tell what is wrong by performing a physical exam. Skin or blood tests may also be done to determine the cause of your hives. In some cases, the cause cannot be determined. TREATMENT  Mild cases usually get better with medicines such as antihistamines. Severe  cases may require an emergency epinephrine injection. If the cause of your hives is known, treatment includes avoiding that trigger.  HOME CARE INSTRUCTIONS   Avoid causes that trigger your hives.  Take antihistamines as directed by your caregiver to reduce the severity of your hives. Non-sedating or low-sedating antihistamines are usually recommended. Do not drive while taking an antihistamine.  Take any other medicines prescribed for itching as directed by your caregiver.  Wear loose-fitting clothing.  Keep all follow-up appointments as directed by your caregiver. SEEK MEDICAL CARE IF:   You have persistent or severe itching that is not relieved with medicine.  You have painful or swollen joints. SEEK IMMEDIATE MEDICAL CARE IF:   You have a fever.  Your tongue or lips are swollen.  You have trouble breathing or swallowing.  You feel tightness in the throat or chest.  You have abdominal pain. These problems may be the first sign of a life-threatening allergic reaction. Call your local emergency services (911 in [place] MAKE SURE YOU:   Understand these instructions.  Will watch your condition.  Will get help right away if you are not doing well or get worse.   This information is not intended to replace advice given to you by your health care provider. Make sure you discuss any questions you have with your health care provider.   Document Released: 06/09/2005 Document Revised: 06/14/2013 Document Reviewed: 09/02/2011 Elsevier Interactive Patient Education [employer] Elsevier Inc.

## 2016-06-25 ENCOUNTER — Ambulatory Visit (INDEPENDENT_AMBULATORY_CARE_PROVIDER_SITE_OTHER): Payer: BC Managed Care – PPO | Admitting: Emergency Medicine

## 2016-06-25 VITALS — BP 122/72 | HR 110 | Temp 98.4°F | Resp 17 | Ht 65.5 in | Wt 114.0 lb

## 2016-06-25 DIAGNOSIS — A09 Infectious gastroenteritis and colitis, unspecified: Secondary | ICD-10-CM

## 2016-06-25 DIAGNOSIS — E860 Dehydration: Secondary | ICD-10-CM | POA: Diagnosis not present

## 2016-06-25 DIAGNOSIS — R112 Nausea with vomiting, unspecified: Secondary | ICD-10-CM | POA: Insufficient documentation

## 2016-06-25 DIAGNOSIS — K529 Noninfective gastroenteritis and colitis, unspecified: Secondary | ICD-10-CM

## 2016-06-25 DIAGNOSIS — R197 Diarrhea, unspecified: Secondary | ICD-10-CM | POA: Insufficient documentation

## 2016-06-25 MED ORDER — DEXTROSE-NACL 5-0.9 % IV SOLN
INTRAVENOUS | Status: AC
  Administered 2016-06-25: 16:00:00 via INTRAVENOUS

## 2016-06-25 MED ORDER — ONDANSETRON HCL 4 MG PO TABS: 4.0000 mg | ORAL_TABLET | Freq: Three times a day (TID) | ORAL | 0 refills | Status: AC | PRN

## 2016-06-25 MED ORDER — ONDANSETRON HCL 4 MG/2ML IJ SOLN
2.0000 mg | Freq: Once | INTRAMUSCULAR | Status: AC
  Administered 2016-06-25: 2 mg via INTRAVENOUS

## 2016-06-25 NOTE — Progress Notes (Signed)
Morgan Mercer 21 y.o.   Chief Complaint  Patient presents with  . Emesis  . Diarrhea    HISTORY OF PRESENT ILLNESS: This is a 21 y.o. female complaining of n/v, diarrhea, colicky diffuse abdominal pain since midnight; also c/o generalized muscle cramping.  GI Problem  The primary symptoms include abdominal pain, nausea, vomiting, diarrhea and myalgias. The illness began today (midnight). The onset was sudden. The problem has been gradually worsening.  The illness is also significant for chills and anorexia. Risk factors: none.     Prior to Admission medications   Medication Sig Start Date End Date Taking? Authorizing Provider  etonogestrel (NEXPLANON) 68 MG IMPL implant    Yes Historical Provider, MD  hydrOXYzine (ATARAX/VISTARIL) 50 MG tablet Take 0.5-1 tablets (25-50 mg total) by mouth 3 (three) times daily as needed for itching. Patient not taking: Reported on 06/25/2016 12/22/15   Sunnie Nielsen, DO  ipratropium (ATROVENT) 0.03 % nasal spray Place 2 sprays into the nose 4 (four) times daily. Patient not taking: Reported on 06/25/2016 06/14/15   Gwenlyn Found Copland, MD  lisdexamfetamine (VYVANSE) 20 MG capsule Take 20 mg by mouth daily.    Historical Provider, MD  methylPREDNISolone (MEDROL DOSEPAK) 4 MG TBPK tablet 6-day pack as directed Patient not taking: Reported on 06/25/2016 12/22/15   Sunnie Nielsen, DO    No Known Allergies  Patient Active Problem List   Diagnosis Date Noted  . Diarrhea 06/25/2016  . Nausea and vomiting 06/25/2016  . Gastroenteritis, acute 10/03/2012  . Dehydration 10/03/2012  . Abdominal pain 10/03/2012    No past medical history on file.  Past Surgical History:  Procedure Laterality Date  . WISDOM TOOTH EXTRACTION     x4; 07/19/14    Social History   Social History  . Marital status: Single    Spouse name: N/A  . Number of children: N/A  . Years of education: N/A   Occupational History  . Not on file.   Social History Main Topics  .  Smoking status: Never Smoker  . Smokeless tobacco: Never Used  . Alcohol use No  . Drug use: No  . Sexual activity: Not on file   Other Topics Concern  . Not on file   Social History Narrative  . No narrative on file    Family History  Problem Relation Age of Onset  . Asthma Sister   . Hypertension Maternal Grandmother   . Diabetes Paternal Uncle   . Cancer Paternal Uncle   . Hypothyroidism Maternal Grandfather   . Hypothyroidism Paternal Grandmother   . Hypothyroidism Maternal Aunt      Review of Systems  Constitutional: Positive for chills.  HENT: Negative.   Eyes: Negative.   Respiratory: Negative.   Cardiovascular: Negative.   Gastrointestinal: Positive for abdominal pain, anorexia, diarrhea, nausea and vomiting.  Genitourinary: Negative.   Musculoskeletal: Positive for myalgias.  Skin: Negative.   Endo/Heme/Allergies: Negative.   Psychiatric/Behavioral: Negative.   All other systems reviewed and are negative.   Vitals:   06/25/16 1437  BP: 122/72  Pulse: (!) 110  Resp: 17  Temp: 98.4 F (36.9 C)    Physical Exam  Constitutional: She is oriented to person, place, and time. She appears well-developed and well-nourished.  HENT:  Head: Normocephalic and atraumatic.  Mouth/Throat: Oropharynx is clear and moist.  Eyes: Conjunctivae and EOM are normal. Pupils are equal, round, and reactive to light.  Neck: Normal range of motion. Neck supple.  Cardiovascular: Normal rate, regular rhythm,  normal heart sounds and intact distal pulses.   Pulmonary/Chest: Effort normal and breath sounds normal.  Abdominal: Soft. Bowel sounds are normal. She exhibits no distension. There is no tenderness.  Musculoskeletal: Normal range of motion.  Neurological: She is alert and oriented to person, place, and time.  Skin: Skin is warm and dry. Capillary refill takes less than 2 seconds.  Psychiatric: She has a normal mood and affect. Her behavior is normal.  Vitals  reviewed.    ASSESSMENT & PLAN: Much improved after IVF's. Morgan Mercer was seen today for emesis and diarrhea.  Diagnoses and all orders for this visit:  Acute gastroenteritis  Dehydration  Diarrhea of infectious origin -     dextrose 5 %-0.9 % sodium chloride infusion; Inject into the vein continuous. -     ondansetron (ZOFRAN) injection 2 mg; Inject 1 mL (2 mg total) into the vein once.  Nausea and vomiting, intractability of vomiting not specified, unspecified vomiting type  Other orders -     ondansetron (ZOFRAN) 4 MG tablet; Take 1 tablet (4 mg total) by mouth every 8 (eight) hours as needed for nausea or vomiting.    Patient Instructions       IF you received an x-ray today, you will receive an invoice from [facility]. Please contact [facility] at [phone] with questions or concerns regarding your invoice.   IF you received labwork today, you will receive an invoice from [facility]. Please contact [facility] at [phone] with questions or concerns regarding your invoice.   Our billing staff will not be able to assist you with questions regarding bills from these companies.  You will be contacted with the lab results as soon as they are available. The fastest way to get your results is to activate your My Chart account. Instructions are located on the last page of this paperwork. If you have not heard from [place] regarding the results in 2 weeks, please contact this office.      Food Choices to Help Relieve Diarrhea, Adult When you have diarrhea, the foods you eat and your eating habits are very important. Choosing the right foods and drinks can help relieve diarrhea. Also, because diarrhea can last up to 7 days, you need to replace lost fluids and electrolytes (such as sodium, potassium, and chloride) in order to help prevent dehydration. What general guidelines do I need to follow?  Slowly drink 1 cup (8 oz) of fluid for each episode of diarrhea. If  you are getting enough fluid, your urine will be clear or pale yellow.  Eat starchy foods. Some good choices include white rice, white toast, pasta, low-fiber cereal, baked potatoes (without the skin), saltine crackers, and bagels.  Avoid large servings of any cooked vegetables.  Limit fruit to two servings per day. A serving is  cup or 1 small piece.  Choose foods with less than 2 g of fiber per serving.  Limit fats to less than 8 tsp (38 g) per day.  Avoid fried foods.  Eat foods that have probiotics in them. Probiotics can be found in certain dairy products.  Avoid foods and beverages that may increase the speed at which food moves through the stomach and intestines (gastrointestinal tract). Things to avoid include:  High-fiber foods, such as dried fruit, raw fruits and vegetables, nuts, seeds, and whole grain foods.  Spicy foods and high-fat foods.  Foods and beverages sweetened with high-fructose corn syrup, honey, or sugar alcohols such as xylitol, sorbitol, and mannitol. What foods  are recommended? Grains  White rice. White, [place], or pita breads (fresh or toasted), including plain rolls, buns, or bagels. White pasta. Saltine, soda, or graham crackers. Pretzels. Low-fiber cereal. Cooked cereals made with water (such as cornmeal, farina, or cream cereals). Plain muffins. Matzo. Melba toast. Zwieback. Vegetables  Potatoes (without the skin). Strained tomato and vegetable juices. Most well-cooked and canned vegetables without seeds. Tender lettuce. Fruits  Cooked or canned applesauce, apricots, cherries, fruit cocktail, grapefruit, peaches, pears, or plums. Fresh bananas, apples without skin, cherries, grapes, cantaloupe, grapefruit, peaches, oranges, or plums. Meat and Other Protein Products  Baked or boiled chicken. Eggs. Tofu. Fish. Seafood. Smooth peanut butter. Ground or well-cooked tender beef, ham, veal, lamb, pork, or poultry. Dairy  Plain yogurt, kefir, and unsweetened  liquid yogurt. Lactose-free milk, buttermilk, or soy milk. Plain hard cheese. Beverages  Sport drinks. Clear broths. Diluted fruit juices (except prune). Regular, caffeine-free sodas such as ginger ale. Water. Decaffeinated teas. Oral rehydration solutions. Sugar-free beverages not sweetened with sugar alcohols. Other  Bouillon, broth, or soups made from recommended foods. The items listed above may not be a complete list of recommended foods or beverages. Contact your dietitian for more options.  What foods are not recommended? Grains  Whole grain, whole wheat, bran, or rye breads, rolls, pastas, crackers, and cereals. Wild or brown rice. Cereals that contain more than 2 g of fiber per serving. Corn tortillas or taco shells. Cooked or dry oatmeal. Granola. Popcorn. Vegetables  Raw vegetables. Cabbage, broccoli, Brussels sprouts, artichokes, baked beans, beet greens, corn, kale, legumes, peas, sweet potatoes, and yams. Potato skins. Cooked spinach and cabbage. Fruits  Dried fruit, including raisins and dates. Raw fruits. Stewed or dried prunes. Fresh apples with skin, apricots, mangoes, pears, raspberries, and strawberries. Meat and Other Protein Products  Chunky peanut butter. Nuts and seeds. Beans and lentils. Tomasa BlaseBacon. Dairy  High-fat cheeses. Milk, chocolate milk, and beverages made with milk, such as milk shakes. Cream. Ice cream. Sweets and Desserts  Sweet rolls, doughnuts, and sweet breads. Pancakes and waffles. Fats and Oils  Butter. Cream sauces. Margarine. Salad oils. Plain salad dressings. Olives. Avocados. Beverages  Caffeinated beverages (such as coffee, tea, soda, or energy drinks). Alcoholic beverages. Fruit juices with pulp. Prune juice. Soft drinks sweetened with high-fructose corn syrup or sugar alcohols. Other  Coconut. Hot sauce. Chili powder. Mayonnaise. Gravy. Cream-based or milk-based soups. The items listed above may not be a complete list of foods and beverages to  avoid. Contact your dietitian for more information.  What should I do if I become dehydrated? Diarrhea can sometimes lead to dehydration. Signs of dehydration include dark urine and dry mouth and skin. If you think you are dehydrated, you should rehydrate with an oral rehydration solution. These solutions can be purchased at pharmacies, retail stores, or online. Drink -1 cup (120-240 mL) of oral rehydration solution each time you have an episode of diarrhea. If drinking this amount makes your diarrhea worse, try drinking smaller amounts more often. For example, drink 1-3 tsp (5-15 mL) every 5-10 minutes. A general rule for staying hydrated is to drink 1-2 L of fluid per day. Talk to your health care provider about the specific amount you should be drinking each day. Drink enough fluids to keep your urine clear or pale yellow. This information is not intended to replace advice given to you by your health care provider. Make sure you discuss any questions you have with your health care provider. Document Released: 08/30/2003 Document Revised:  11/15/2015 Document Reviewed: 05/02/2013 Elsevier Interactive Patient Education  2017 Elsevier Inc.     Edwina Barth, MD [facility]

## 2016-06-25 NOTE — Patient Instructions (Addendum)
   IF you received an x-ray today, you will receive an invoice from [facility]. Please contact [facility] at [phone] with questions or concerns regarding your invoice.   IF you received labwork today, you will receive an invoice from [facility]. Please contact [facility] at [phone] with questions or concerns regarding your invoice.   Our billing staff will not be able to assist you with questions regarding bills from these companies.  You will be contacted with the lab results as soon as they are available. The fastest way to get your results is to activate your My Chart account. Instructions are located on the last page of this paperwork. If you have not heard from us regarding the results in 2 weeks, please contact this office.     Food Choices to Help Relieve Diarrhea, Adult When you have diarrhea, the foods you eat and your eating habits are very important. Choosing the right foods and drinks can help relieve diarrhea. Also, because diarrhea can last up to 7 days, you need to replace lost fluids and electrolytes (such as sodium, potassium, and chloride) in order to help prevent dehydration. What general guidelines do I need to follow?  Slowly drink 1 cup (8 oz) of fluid for each episode of diarrhea. If you are getting enough fluid, your urine will be clear or pale yellow.  Eat starchy foods. Some good choices include white rice, white toast, pasta, low-fiber cereal, baked potatoes (without the skin), saltine crackers, and bagels.  Avoid large servings of any cooked vegetables.  Limit fruit to two servings per day. A serving is  cup or 1 small piece.  Choose foods with less than 2 g of fiber per serving.  Limit fats to less than 8 tsp (38 g) per day.  Avoid fried foods.  Eat foods that have probiotics in them. Probiotics can be found in certain dairy products.  Avoid foods and beverages that may increase the speed at which food moves through the  stomach and intestines (gastrointestinal tract). Things to avoid include:  High-fiber foods, such as dried fruit, raw fruits and vegetables, nuts, seeds, and whole grain foods.  Spicy foods and high-fat foods.  Foods and beverages sweetened with high-fructose corn syrup, honey, or sugar alcohols such as xylitol, sorbitol, and mannitol. What foods are recommended? Grains  White rice. White, French, or pita breads (fresh or toasted), including plain rolls, buns, or bagels. White pasta. Saltine, soda, or graham crackers. Pretzels. Low-fiber cereal. Cooked cereals made with water (such as cornmeal, farina, or cream cereals). Plain muffins. Matzo. Melba toast. Zwieback. Vegetables  Potatoes (without the skin). Strained tomato and vegetable juices. Most well-cooked and canned vegetables without seeds. Tender lettuce. Fruits  Cooked or canned applesauce, apricots, cherries, fruit cocktail, grapefruit, peaches, pears, or plums. Fresh bananas, apples without skin, cherries, grapes, cantaloupe, grapefruit, peaches, oranges, or plums. Meat and Other Protein Products  Baked or boiled chicken. Eggs. Tofu. Fish. Seafood. Smooth peanut butter. Ground or well-cooked tender beef, ham, veal, lamb, pork, or poultry. Dairy  Plain yogurt, kefir, and unsweetened liquid yogurt. Lactose-free milk, buttermilk, or soy milk. Plain hard cheese. Beverages  Sport drinks. Clear broths. Diluted fruit juices (except prune). Regular, caffeine-free sodas such as ginger ale. Water. Decaffeinated teas. Oral rehydration solutions. Sugar-free beverages not sweetened with sugar alcohols. Other  Bouillon, broth, or soups made from recommended foods. The items listed above may not be a complete list of recommended foods or beverages. Contact your dietitian for more options.    What foods are not recommended? Grains  Whole grain, whole wheat, bran, or rye breads, rolls, pastas, crackers, and cereals. Wild or brown rice. Cereals that  contain more than 2 g of fiber per serving. Corn tortillas or taco shells. Cooked or dry oatmeal. Granola. Popcorn. Vegetables  Raw vegetables. Cabbage, broccoli, Brussels sprouts, artichokes, baked beans, beet greens, corn, kale, legumes, peas, sweet potatoes, and yams. Potato skins. Cooked spinach and cabbage. Fruits  Dried fruit, including raisins and dates. Raw fruits. Stewed or dried prunes. Fresh apples with skin, apricots, mangoes, pears, raspberries, and strawberries. Meat and Other Protein Products  Chunky peanut butter. Nuts and seeds. Beans and lentils. Bacon. Dairy  High-fat cheeses. Milk, chocolate milk, and beverages made with milk, such as milk shakes. Cream. Ice cream. Sweets and Desserts  Sweet rolls, doughnuts, and sweet breads. Pancakes and waffles. Fats and Oils  Butter. Cream sauces. Margarine. Salad oils. Plain salad dressings. Olives. Avocados. Beverages  Caffeinated beverages (such as coffee, tea, soda, or energy drinks). Alcoholic beverages. Fruit juices with pulp. Prune juice. Soft drinks sweetened with high-fructose corn syrup or sugar alcohols. Other  Coconut. Hot sauce. Chili powder. Mayonnaise. Gravy. Cream-based or milk-based soups. The items listed above may not be a complete list of foods and beverages to avoid. Contact your dietitian for more information.  What should I do if I become dehydrated? Diarrhea can sometimes lead to dehydration. Signs of dehydration include dark urine and dry mouth and skin. If you think you are dehydrated, you should rehydrate with an oral rehydration solution. These solutions can be purchased at pharmacies, retail stores, or online. Drink -1 cup (120-240 mL) of oral rehydration solution each time you have an episode of diarrhea. If drinking this amount makes your diarrhea worse, try drinking smaller amounts more often. For example, drink 1-3 tsp (5-15 mL) every 5-10 minutes. A general rule for staying hydrated is to drink 1-2 L of  fluid per day. Talk to your health care provider about the specific amount you should be drinking each day. Drink enough fluids to keep your urine clear or pale yellow. This information is not intended to replace advice given to you by your health care provider. Make sure you discuss any questions you have with your health care provider. Document Released: 08/30/2003 Document Revised: 11/15/2015 Document Reviewed: 05/02/2013 Elsevier Interactive Patient Education  2017 [employer] Inc.
